# Patient Record
Sex: Male | Born: 1955 | Race: Black or African American | Hispanic: No | Marital: Married
Health system: Southern US, Community
[De-identification: ages and names within clinical notes are randomized; demographics above are authoritative.]

## PROBLEM LIST (undated history)

## (undated) DIAGNOSIS — I10 Essential (primary) hypertension: Secondary | ICD-10-CM

---

## 2005-08-07 ENCOUNTER — Emergency Department: Payer: Self-pay | Admitting: Emergency Medicine

## 2005-08-25 ENCOUNTER — Emergency Department: Payer: Self-pay | Admitting: Internal Medicine

## 2005-08-25 ENCOUNTER — Other Ambulatory Visit: Payer: Self-pay

## 2007-04-10 ENCOUNTER — Encounter: Payer: Self-pay | Admitting: Family Medicine

## 2007-04-21 ENCOUNTER — Encounter: Payer: Self-pay | Admitting: Family Medicine

## 2007-05-21 ENCOUNTER — Encounter: Payer: Self-pay | Admitting: Family Medicine

## 2007-06-21 ENCOUNTER — Encounter: Payer: Self-pay | Admitting: Family Medicine

## 2012-09-03 ENCOUNTER — Inpatient Hospital Stay: Payer: Self-pay | Admitting: Unknown Physician Specialty

## 2012-09-03 LAB — COMPREHENSIVE METABOLIC PANEL
Alkaline Phosphatase: 194 U/L — ABNORMAL HIGH (ref 50–136)
Anion Gap: 9 (ref 7–16)
BUN: 2 mg/dL — ABNORMAL LOW (ref 7–18)
Bilirubin,Total: 0.3 mg/dL (ref 0.2–1.0)
Co2: 24 mmol/L (ref 21–32)
Creatinine: 0.79 mg/dL (ref 0.60–1.30)
EGFR (African American): >60
EGFR (Non-African Amer.): >60
Osmolality: 267 (ref 275–301)
Potassium: 3.8 mmol/L (ref 3.5–5.1)
SGOT(AST): 76 U/L — ABNORMAL HIGH (ref 15–37)
SGPT (ALT): 48 U/L (ref 12–78)
Total Protein: 8.5 g/dL — ABNORMAL HIGH (ref 6.4–8.2)

## 2012-09-03 LAB — DRUG SCREEN, URINE
Amphetamines, Ur Screen: NEGATIVE (ref ?–1000)
Barbiturates, Ur Screen: NEGATIVE (ref ?–200)
Benzodiazepine, Ur Scrn: NEGATIVE (ref ?–200)
Cannabinoid 50 Ng, Ur ~~LOC~~: NEGATIVE (ref ?–50)
MDMA (Ecstasy)Ur Screen: NEGATIVE (ref ?–500)
Methadone, Ur Screen: NEGATIVE (ref ?–300)
Opiate, Ur Screen: NEGATIVE (ref ?–300)
Phencyclidine (PCP) Ur S: NEGATIVE (ref ?–25)

## 2012-09-03 LAB — CBC
MCH: 34.5 pg — ABNORMAL HIGH (ref 26.0–34.0)
MCHC: 34.5 g/dL (ref 32.0–36.0)
MCV: 100 fL (ref 80–100)
Platelet: 222 10*3/uL (ref 150–440)
RBC: 4.18 10*6/uL — ABNORMAL LOW (ref 4.40–5.90)
WBC: 10.5 10*3/uL (ref 3.8–10.6)

## 2012-09-03 LAB — TSH: Thyroid Stimulating Horm: 2.48 u[IU]/mL

## 2012-11-07 ENCOUNTER — Emergency Department: Payer: Self-pay | Admitting: Emergency Medicine

## 2014-02-12 ENCOUNTER — Emergency Department: Payer: Self-pay | Admitting: Emergency Medicine

## 2014-02-12 LAB — CBC WITH DIFFERENTIAL/PLATELET
Basophil #: 0.1 10*3/uL (ref 0.0–0.1)
Basophil %: 0.6 %
EOS PCT: 2.4 %
Eosinophil #: 0.4 10*3/uL (ref 0.0–0.7)
HCT: 35.7 % — AB (ref 40.0–52.0)
HGB: 11.3 g/dL — AB (ref 13.0–18.0)
LYMPHS ABS: 3.6 10*3/uL (ref 1.0–3.6)
Lymphocyte %: 21.2 %
MCH: 26.4 pg (ref 26.0–34.0)
MCHC: 31.6 g/dL — ABNORMAL LOW (ref 32.0–36.0)
MCV: 84 fL (ref 80–100)
MONOS PCT: 8.2 %
Monocyte #: 1.4 x10 3/mm — ABNORMAL HIGH (ref 0.2–1.0)
NEUTROS ABS: 11.5 10*3/uL — AB (ref 1.4–6.5)
Neutrophil %: 67.6 %
Platelet: 370 10*3/uL (ref 150–440)
RBC: 4.26 10*6/uL — ABNORMAL LOW (ref 4.40–5.90)
RDW: 16.7 % — ABNORMAL HIGH (ref 11.5–14.5)
WBC: 17 10*3/uL — ABNORMAL HIGH (ref 3.8–10.6)

## 2014-02-12 LAB — COMPREHENSIVE METABOLIC PANEL
ALT: 11 U/L — AB (ref 12–78)
ANION GAP: 5 — AB (ref 7–16)
AST: 13 U/L — AB (ref 15–37)
Albumin: 3.6 g/dL (ref 3.4–5.0)
Alkaline Phosphatase: 118 U/L — ABNORMAL HIGH
BUN: 9 mg/dL (ref 7–18)
Bilirubin,Total: 0.2 mg/dL (ref 0.2–1.0)
CALCIUM: 8.9 mg/dL (ref 8.5–10.1)
CHLORIDE: 104 mmol/L (ref 98–107)
Co2: 27 mmol/L (ref 21–32)
Creatinine: 1.19 mg/dL (ref 0.60–1.30)
EGFR (African American): >60
Glucose: 130 mg/dL — ABNORMAL HIGH (ref 65–99)
OSMOLALITY: 272 (ref 275–301)
POTASSIUM: 3.7 mmol/L (ref 3.5–5.1)
SODIUM: 136 mmol/L (ref 136–145)
Total Protein: 8.4 g/dL — ABNORMAL HIGH (ref 6.4–8.2)

## 2014-02-12 LAB — TROPONIN I

## 2014-02-12 LAB — LIPASE, BLOOD: Lipase: 203 U/L (ref 73–393)

## 2014-02-13 LAB — URINALYSIS, COMPLETE
BACTERIA: NONE SEEN
BILIRUBIN, UR: NEGATIVE
Glucose,UR: NEGATIVE mg/dL (ref 0–75)
Ketone: NEGATIVE
LEUKOCYTE ESTERASE: NEGATIVE
NITRITE: NEGATIVE
PH: 7 (ref 4.5–8.0)
Protein: NEGATIVE
RBC,UR: 1 /HPF (ref 0–5)
SPECIFIC GRAVITY: 1.008 (ref 1.003–1.030)
Squamous Epithelial: 1

## 2015-03-09 NOTE — Discharge Summary (Signed)
PATIENT NAME:  Martin Brewer, Martin Brewer MR#:  [id number] DATE OF BIRTH:  04/30/1956  DATE OF ADMISSION:  09/03/2012 DATE OF DISCHARGE:  09/06/2012  HISTORY OF PRESENT ILLNESS: Patient was admitted with alcohol dependence. For further details, please see typed attached history and physical.   LABORATORY, DIAGNOSTIC AND RADIOLOGICAL DATA: Glucose, BUN, creatinine, electrolytes within normal limits. Liver function tests showed alkaline phosphatase at 194, SGOT of 76 and total protein 8.5, otherwise within normal limits. Ethanol blood level on admission was 0.024. CBC essentially within normal limits with slightly elevated MCH of 34.5. TSH 2.48, within normal limits. Drug screen is negative.   HOSPITAL COURSE: Patient was withdrawn from alcohol using Ativan, thiamine, vitamins, etc. At the time of discharge he was still having some withdrawal symptoms and still needed occasional Ativan. In addition, his depression improved. He was no longer suicidal and he began eating and drinking better. He did attend groups and did agree to go to [facility] for further substance abuse treatment.   Patient did have a gout attack in the hospital which partially responded to Indocin and that needs to be added to his discharge diagnoses.    DIAGNOSES:  AXIS I:  1. Alcohol dependence.  2. Depressive disorder, not otherwise specified.   AXIS III:  1. Alcohol withdrawal state.  2. Hypertension.  3. Gastroesophageal reflux disease. 4. Allergies.  5. Status post bilateral hip replacement. 6. Gout.   CONDITION ON DISCHARGE: Fair to good.   DISPOSITION: The patient is referred to [facility] for further substance abuse treatment.   MEDICATIONS ON DISCHARGE:  1. Norvasc 5 mg b.i.d.  2. Vitamin D3 400 units daily.  3. Fluticasone propionate nasal spray two sprays both nostrils daily.  4. Vitamins and minerals per CIWA protocol.  5. Prilosec 20 mg p.o. q.a.m.  6. Ativan 1 mg q.2 hours p.r.n. per CIWA protocol. 7. Indocin 25 mg q.2  hours p.r.n. not to exceed 150 mg daily.   DIET AND ACTIVITY: As tolerated.   ____________________________ Venida JarvisWilliam James Shalay Carder II, MD wjr:cms D: 09/06/2012 11:12:35 ET T: 09/06/2012 11:32:06 ET JOB#: [id number]  cc: Venida JarvisWilliam James Jasmen Emrich II, MD, <Dictator> Jules HusbandsWILLIAM J Sebastian Lurz MD ELECTRONICALLY SIGNED 09/06/2012 15:02
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
## 2016-01-14 IMAGING — CT CT ABD-PELV W/ CM
2 of 5 series · 17 of 46 positions shown, 19 images · IV contrast (isovue)
Comparison: None.

CLINICAL DATA: Right lower quadrant pain and leukocytosis.

EXAM:
CT ABDOMEN AND PELVIS WITH CONTRAST
TECHNIQUE: Multidetector CT imaging of the abdomen and pelvis was performed
using the standard protocol following bolus administration of
intravenous contrast.
CONTRAST:  100 mL Isovue 370

[Series 2: routine abd pel with · axial · 0.65mm/px · z∈[-559,-164]mm · 14 of 89 slices shown, 16 images]
[im 5/89  soft-tissue]
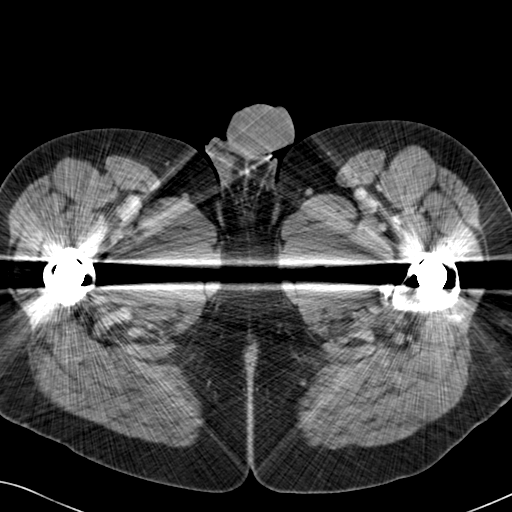
[im 5/89  bone]
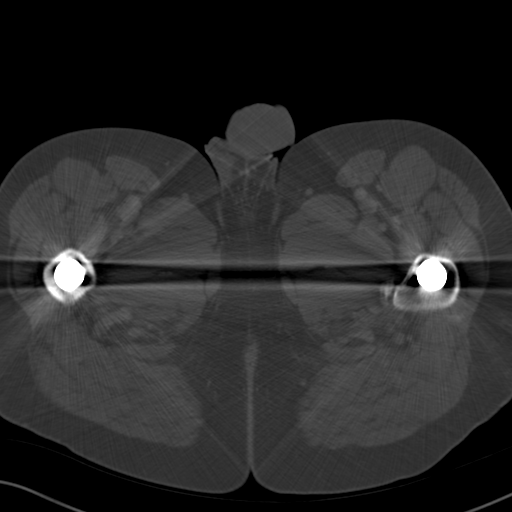
[im 9/89  soft-tissue]
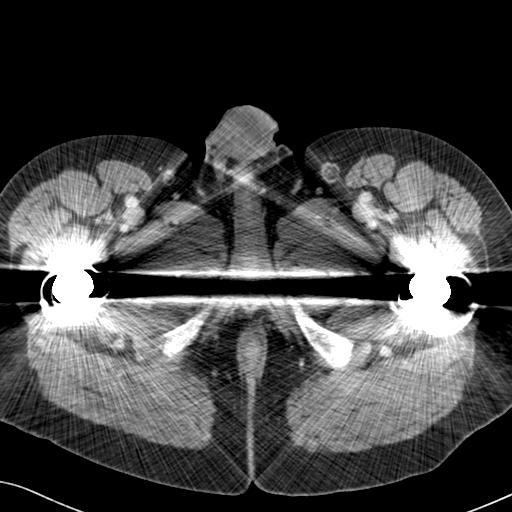
[im 23/89  soft-tissue]
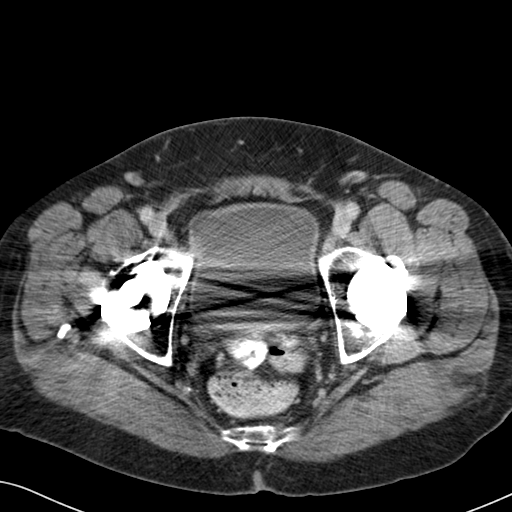
[im 27/89  soft-tissue]
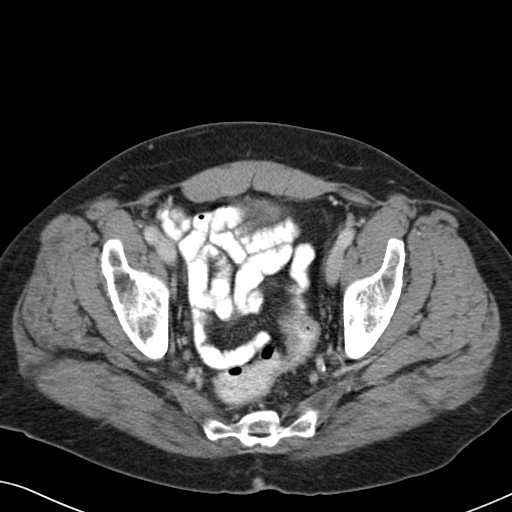
[im 31/89  soft-tissue]
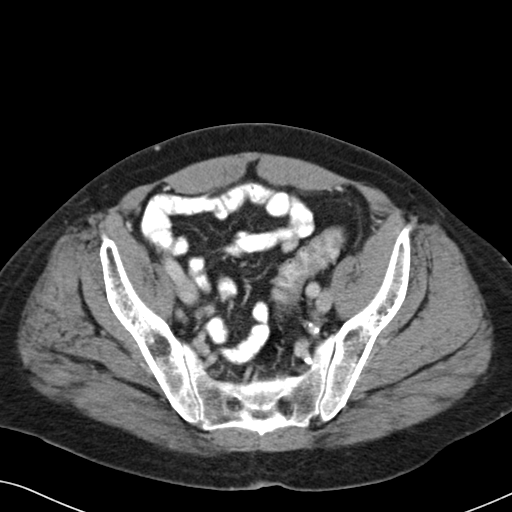
[im 40/89  soft-tissue]
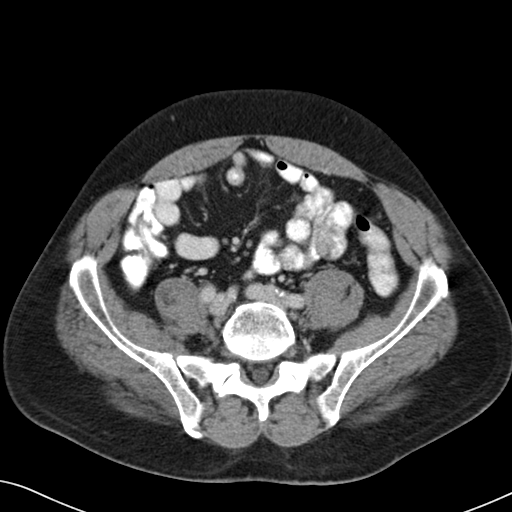
[im 45/89  soft-tissue]
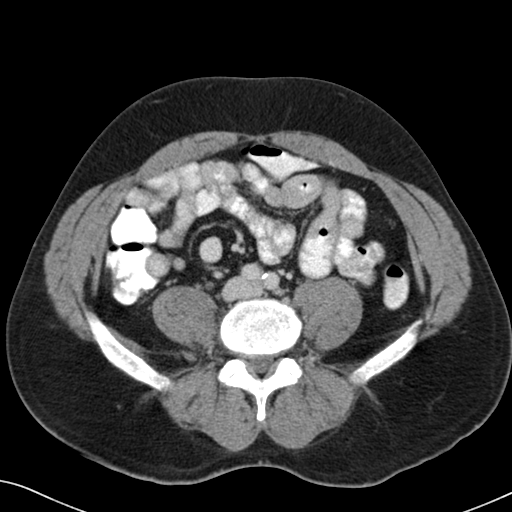
[im 49/89  soft-tissue]
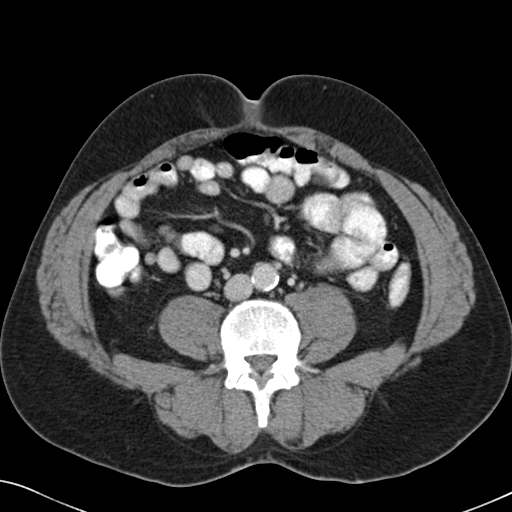
[im 53/89  soft-tissue]
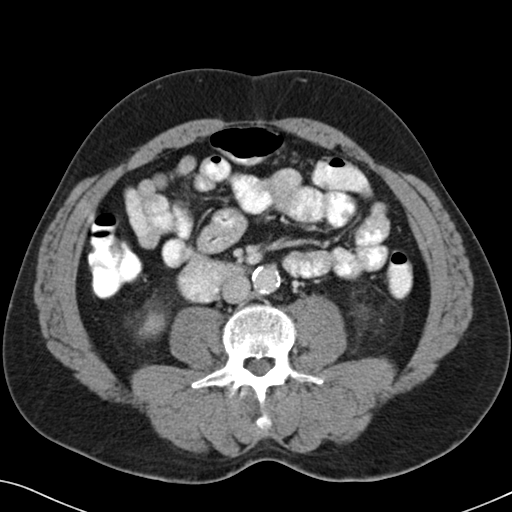
[im 53/89  bone]
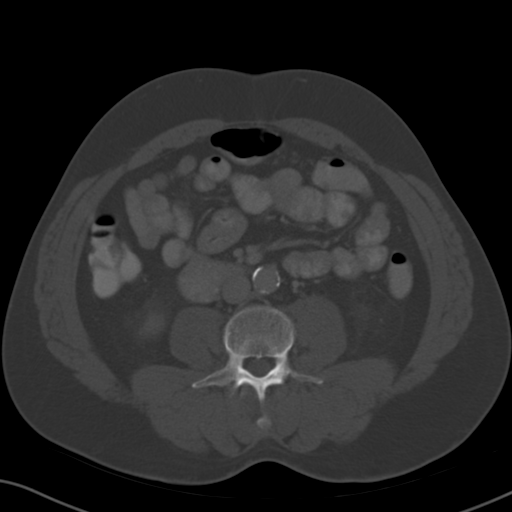
[im 62/89  soft-tissue]
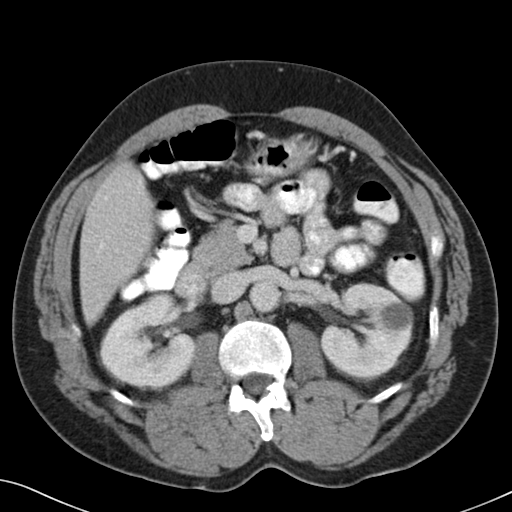
[im 67/89  soft-tissue]
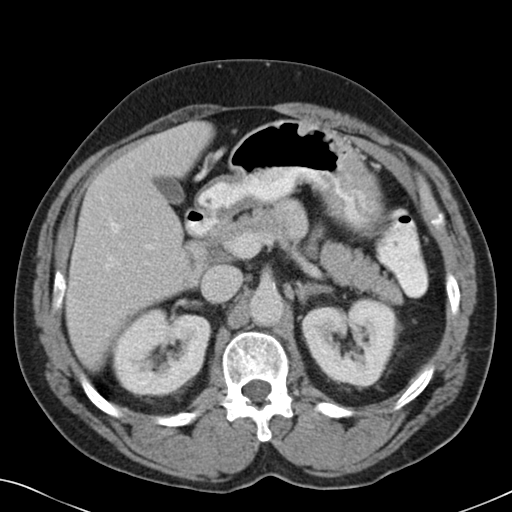
[im 71/89  soft-tissue]
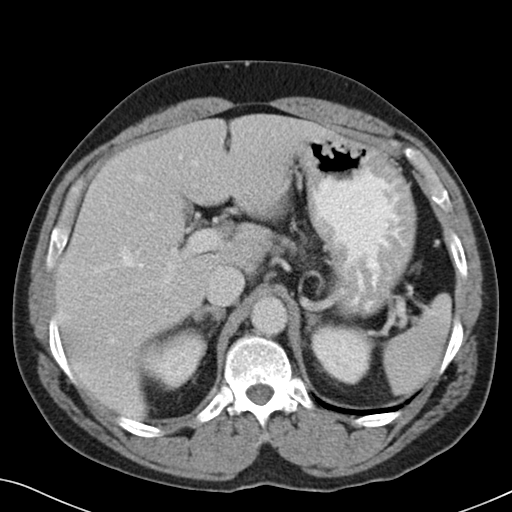
[im 80/89  soft-tissue]
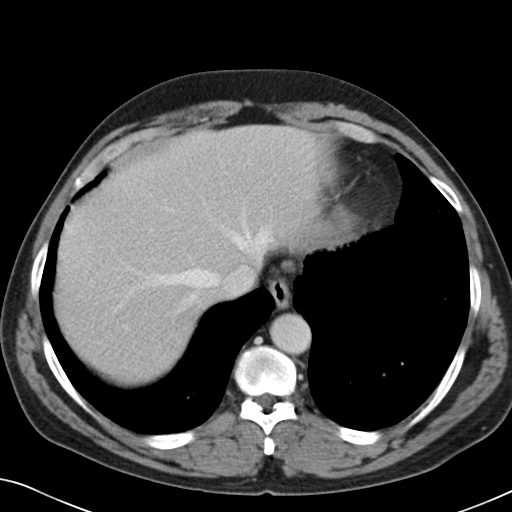
[im 84/89  soft-tissue]
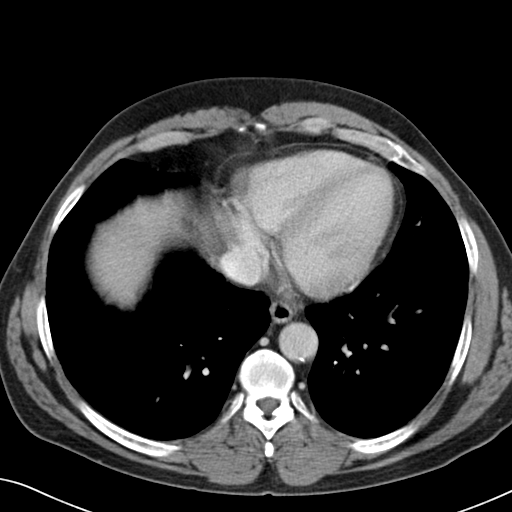

[Series 6: cor routine abd pel with · coronal · 0.89mm/px · 3 of 136 slices shown]
[im 46/136  soft-tissue]
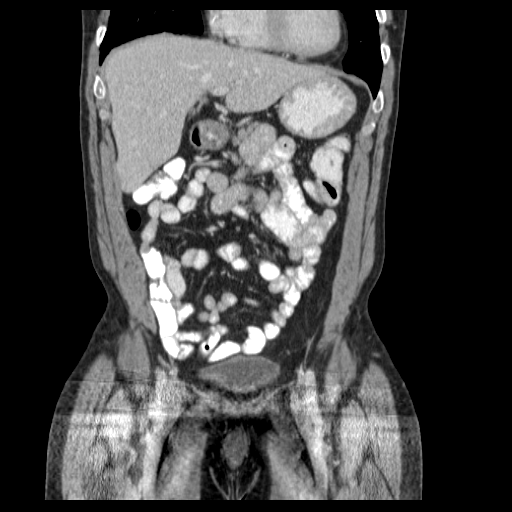
[im 61/136  soft-tissue]
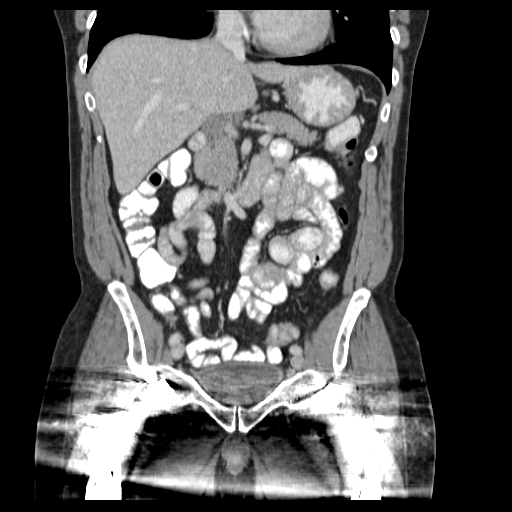
[im 76/136  soft-tissue]
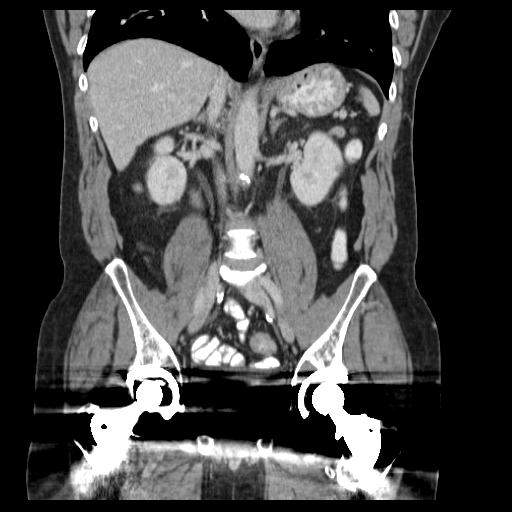

[17 of 46 positions shown; findings below may reference images not displayed]

FINDINGS: Visualized lung bases demonstrate mosaic attenuation of the lungs.
There is no pleural effusion.

The liver, gallbladder, spleen, am right kidney have an unremarkable
enhanced appearance. There is a 2.0 cm cyst in the interpolar left
kidney. The pancreatic head is prominent in size without a definite,
discrete mass identified.

Oral contrast is present in multiple loops of nondilated small bowel
as well as in the colon to the level of the rectum. The appendix is
identified in the right lower quadrant and unremarkable. There is
wall thickening involving the sigmoid colon. No significant
surrounding inflammatory changes are seen.

Evaluation of the lower pelvis is limited by extensive streak
artifact from bilateral total hip arthroplasties. The visualized
portion of the bladder is grossly unremarkable. Moderate aortoiliac
atherosclerotic calcification is present. No free fluid is
identified. Multiple small periportal lymph nodes are present. Soft
tissue between the portal vein and IVC measuring 8 mm in short axis
may also represent lymph nodes. There are prominent deformities of
the L2 inferior endplate and L5 superior endplate with appearance
suggestive of a large Schmorl's nodes.
IMPRESSION: 1. Sigmoid colon wall thickening. This could reflect a mild colitis,
however there is not significant surrounding inflammatory change.
Correlation with patient's symptoms and any recent colonoscopy
results suggested to exclude an underlying mass.
2. Enlargement of the pancreatic head. No discrete mass is
identified.
3. L2 and L5 Schmorl's nodes.

## 2016-10-18 ENCOUNTER — Other Ambulatory Visit: Payer: Self-pay | Admitting: Medical Oncology

## 2016-10-19 ENCOUNTER — Other Ambulatory Visit: Payer: Self-pay | Admitting: Medical Oncology

## 2016-10-19 DIAGNOSIS — R9389 Abnormal findings on diagnostic imaging of other specified body structures: Secondary | ICD-10-CM

## 2016-10-19 DIAGNOSIS — R748 Abnormal levels of other serum enzymes: Secondary | ICD-10-CM

## 2016-10-19 DIAGNOSIS — R590 Localized enlarged lymph nodes: Secondary | ICD-10-CM

## 2016-11-06 ENCOUNTER — Ambulatory Visit
Admission: RE | Admit: 2016-11-06 | Discharge: 2016-11-06 | Disposition: A | Discharge status: Home / Self Care | Payer: Medicare HMO | Source: Ambulatory Visit | Attending: Medical Oncology | Admitting: Medical Oncology

## 2016-11-06 DIAGNOSIS — K760 Fatty (change of) liver, not elsewhere classified: Secondary | ICD-10-CM | POA: Diagnosis not present

## 2016-11-06 DIAGNOSIS — R9389 Abnormal findings on diagnostic imaging of other specified body structures: Secondary | ICD-10-CM

## 2016-11-06 DIAGNOSIS — I700 Atherosclerosis of aorta: Secondary | ICD-10-CM | POA: Insufficient documentation

## 2016-11-06 DIAGNOSIS — R932 Abnormal findings on diagnostic imaging of liver and biliary tract: Secondary | ICD-10-CM | POA: Insufficient documentation

## 2016-11-06 DIAGNOSIS — J439 Emphysema, unspecified: Secondary | ICD-10-CM | POA: Diagnosis not present

## 2016-11-06 DIAGNOSIS — N281 Cyst of kidney, acquired: Secondary | ICD-10-CM | POA: Insufficient documentation

## 2016-11-06 DIAGNOSIS — R748 Abnormal levels of other serum enzymes: Secondary | ICD-10-CM

## 2016-11-06 DIAGNOSIS — R590 Localized enlarged lymph nodes: Secondary | ICD-10-CM

## 2016-11-06 DIAGNOSIS — R935 Abnormal findings on diagnostic imaging of other abdominal regions, including retroperitoneum: Secondary | ICD-10-CM | POA: Insufficient documentation

## 2016-11-06 DIAGNOSIS — I2510 Atherosclerotic heart disease of native coronary artery without angina pectoris: Secondary | ICD-10-CM | POA: Insufficient documentation

## 2016-11-06 HISTORY — DX: Essential (primary) hypertension: I10

## 2016-11-06 MED ORDER — IOPAMIDOL (ISOVUE-300) INJECTION 61%
75.0000 mL | Freq: Once | INTRAVENOUS | Status: AC | PRN
  Administered 2016-11-06: 75 mL via INTRAVENOUS

## 2017-06-07 IMAGING — US US ABDOMEN COMPLETE
1 series · 13 of 25 positions shown · non-contrast
Comparison: CT scan of February 12, 2014.

CLINICAL DATA: Elevated liver enzymes.

EXAM:
ABDOMEN ULTRASOUND COMPLETE

[Series 1: us abdomen complete · 0.19mm/px · 13 of 136 slices shown]
[im 1/136]
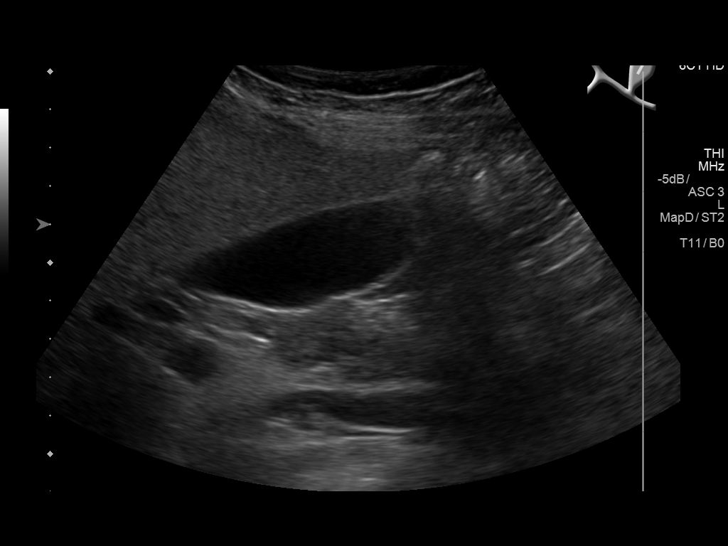
[im 12/136]
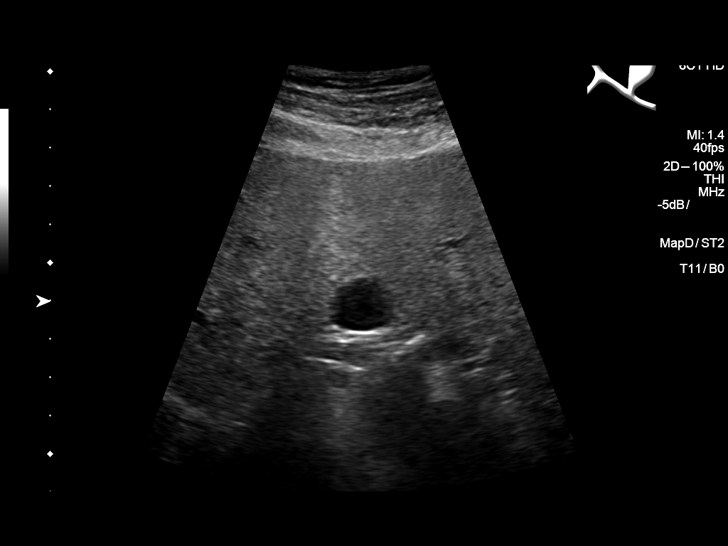
[im 23/136]
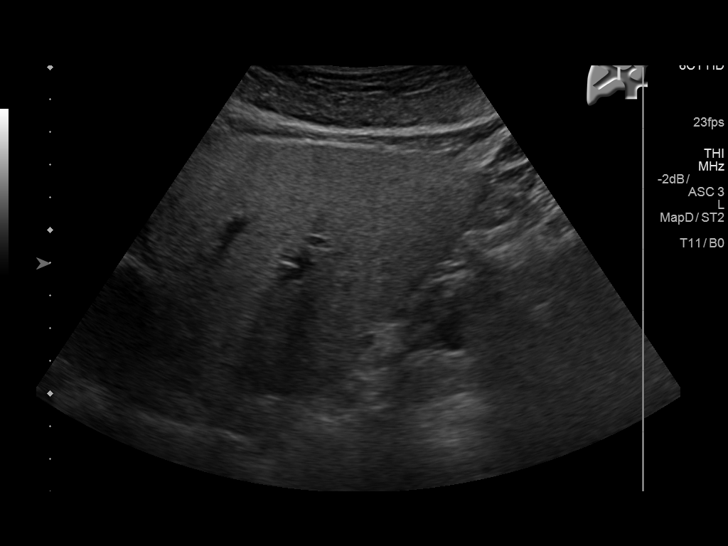
[im 34/136]
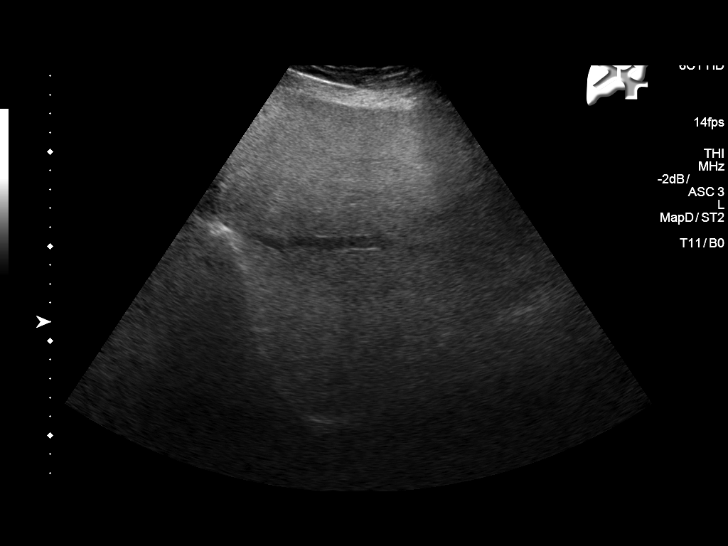
[im 46/136]
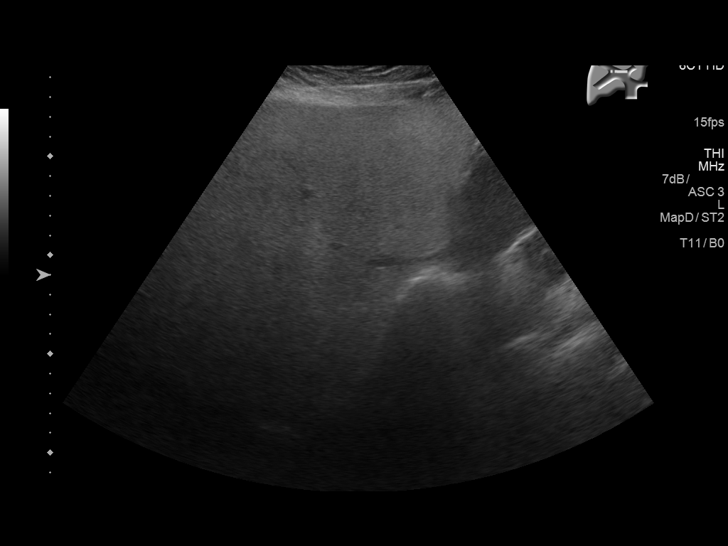
[im 57/136]
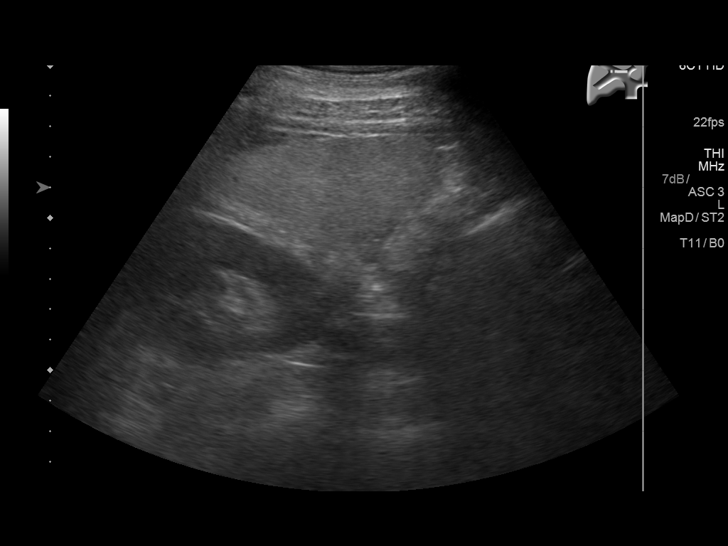
[im 68/136]
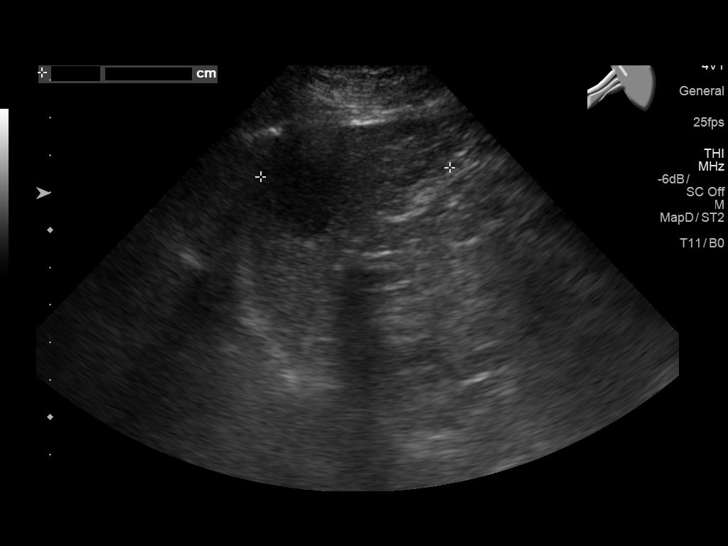
[im 79/136]
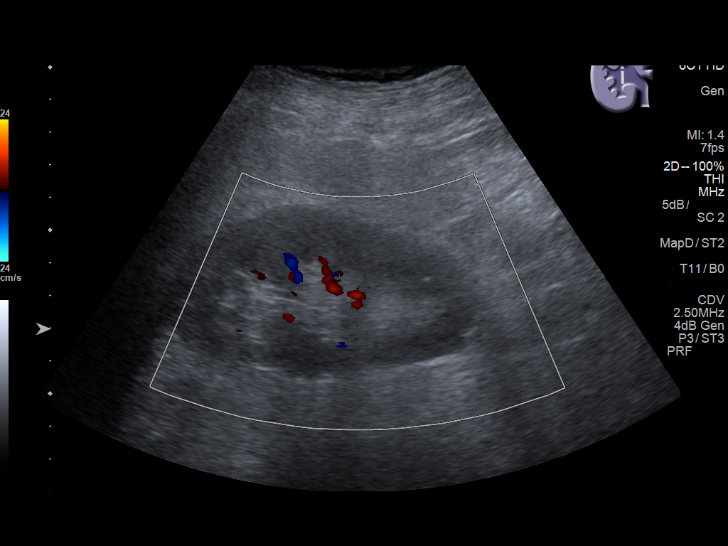
[im 91/136]
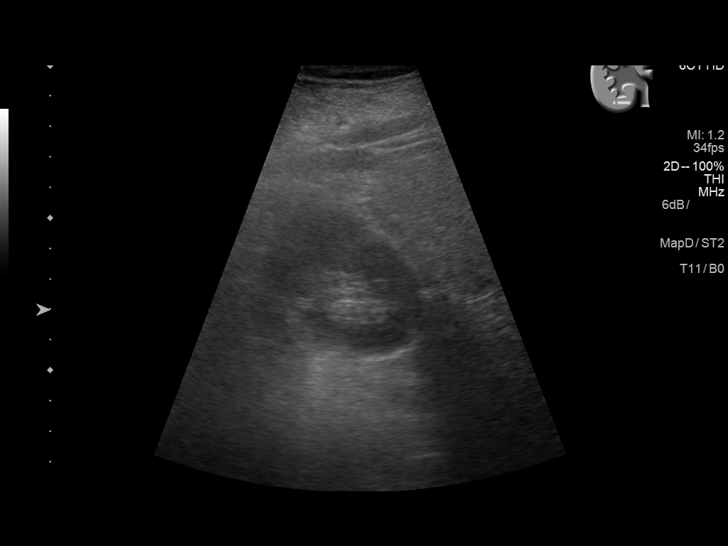
[im 102/136]
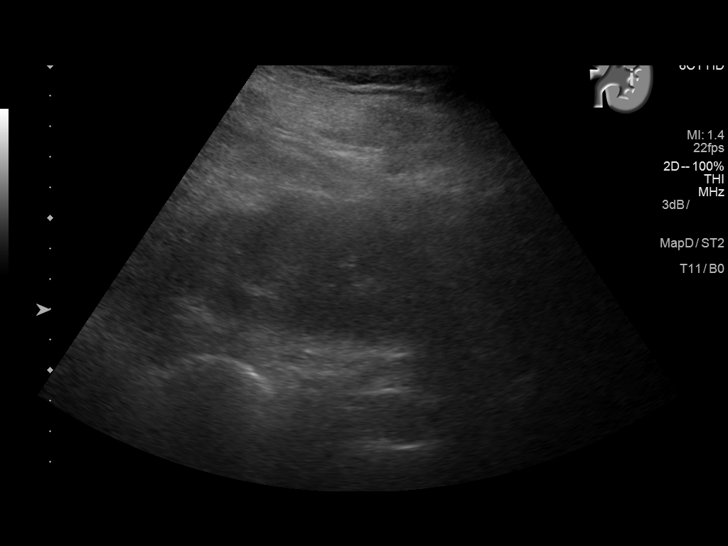
[im 113/136]
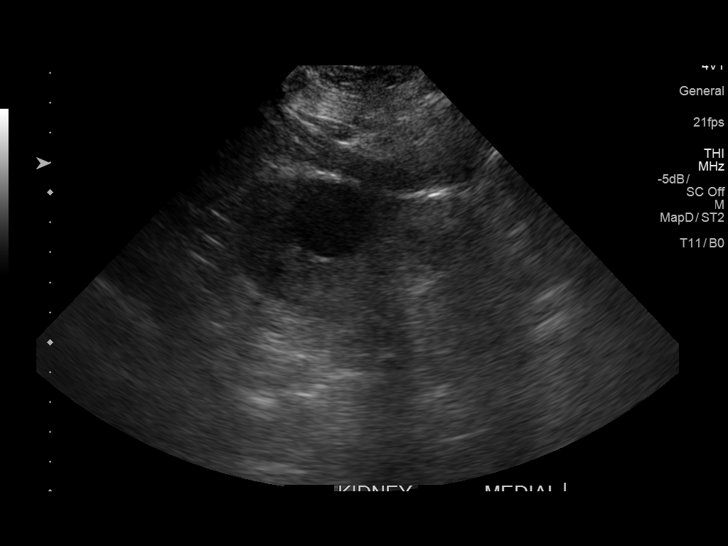
[im 124/136]
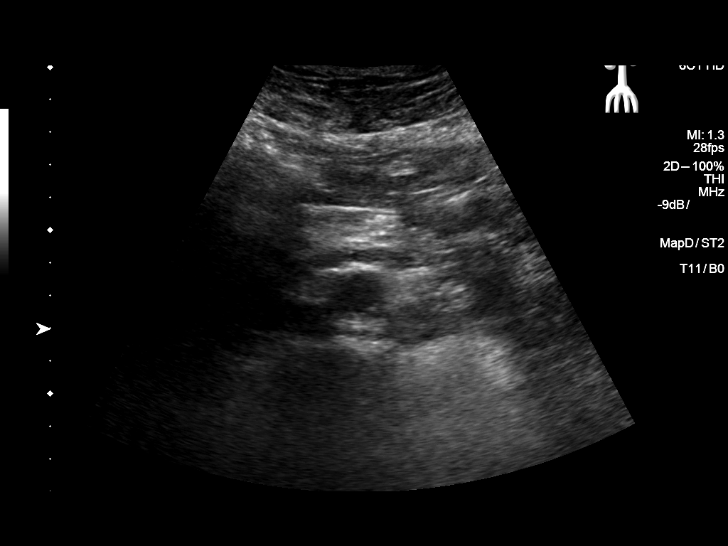
[im 136/136]
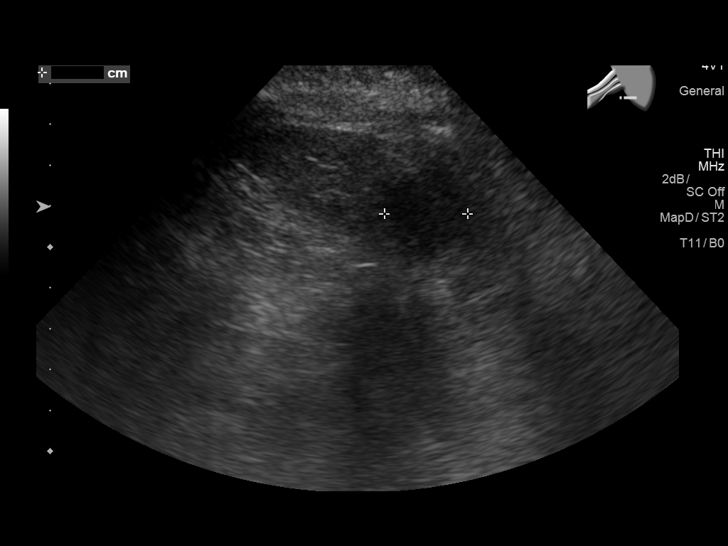

[13 of 25 positions shown; findings below may reference images not displayed]

FINDINGS: Gallbladder: No gallstones or wall thickening visualized. No
sonographic Murphy sign noted by sonographer.

Common bile duct: Diameter: 2 mm which is within normal limits.

Liver: No focal lesion identified. Mildly increased echogenicity is
noted suggesting fatty infiltration.

IVC: No abnormality visualized.

Pancreas: Visualized portion unremarkable.

Spleen: Ill-defined hypoechoic area measuring 2.6 cm is noted.
Potentially this may represent mass.

Right Kidney: Length: 10.1 cm. Echogenicity within normal limits. No
mass or hydronephrosis visualized.

Left Kidney: Length: 10.4 cm. 2.9 cm cyst is seen in lower pole
which was present on prior CT. Echogenicity within normal limits. No
mass or hydronephrosis visualized.

Abdominal aorta: No aneurysm visualized.

Other findings: None.
IMPRESSION: Mildly increased echogenicity of hepatic parenchyma most consistent
with fatty infiltration.

2.9 cm simple left renal cyst.

2.6 cm ill-defined hypoechoic area seen within spleen which
potentially may represent mass or neoplasm. Further evaluation with
CT or MRI scan with and without intravenous contrast administration
is recommended. These results will be called to the ordering
clinician or representative by the Radiologist Assistant, and
communication documented in the PACS or zVision Dashboard.

## 2018-02-18 DEATH — deceased
